# Patient Record
Sex: Female | Born: 1960 | Race: Black or African American | Hispanic: No | Marital: Single | Smoking: Current every day smoker
Health system: Southern US, Community
[De-identification: ages and names within clinical notes are randomized; demographics above are authoritative.]

## PROBLEM LIST (undated history)

## (undated) DIAGNOSIS — I10 Essential (primary) hypertension: Secondary | ICD-10-CM

## (undated) DIAGNOSIS — J45909 Unspecified asthma, uncomplicated: Secondary | ICD-10-CM

---

## 2011-05-09 ENCOUNTER — Other Ambulatory Visit (HOSPITAL_COMMUNITY): Payer: Self-pay | Admitting: Family Medicine

## 2011-05-09 DIAGNOSIS — Z1231 Encounter for screening mammogram for malignant neoplasm of breast: Secondary | ICD-10-CM

## 2011-06-05 ENCOUNTER — Ambulatory Visit (HOSPITAL_COMMUNITY)
Admission: RE | Admit: 2011-06-05 | Discharge: 2011-06-05 | Disposition: A | Discharge status: Home / Self Care | Payer: Self-pay | Source: Ambulatory Visit | Attending: Family Medicine | Admitting: Family Medicine

## 2011-06-05 DIAGNOSIS — Z1231 Encounter for screening mammogram for malignant neoplasm of breast: Secondary | ICD-10-CM | POA: Insufficient documentation

## 2011-07-02 ENCOUNTER — Other Ambulatory Visit: Payer: Self-pay | Admitting: Family Medicine

## 2011-07-02 DIAGNOSIS — R928 Other abnormal and inconclusive findings on diagnostic imaging of breast: Secondary | ICD-10-CM

## 2011-07-09 ENCOUNTER — Ambulatory Visit
Admission: RE | Admit: 2011-07-09 | Discharge: 2011-07-09 | Disposition: A | Discharge status: Home / Self Care | Payer: Self-pay | Source: Ambulatory Visit | Attending: Family Medicine | Admitting: Family Medicine

## 2011-07-09 DIAGNOSIS — R928 Other abnormal and inconclusive findings on diagnostic imaging of breast: Secondary | ICD-10-CM

## 2011-07-10 IMAGING — MG MM DIGITAL DIAGNOSTIC LIMITED BILAT
5 series · 5 of 5 positions shown · non-contrast
Comparison: Prior outside studies from [REDACTED] in LOCKEDOUT
LOCKEDOUT, dated [DATE] bilaterally and [DATE] on the left
are now available for review.

CLINICAL DATA: The patient returns for evaluation of
calcifications in the left breast and right axillary lymph nodes.

DIGITAL DIAGNOSTIC BILATERAL LIMITED MAMMOGRAM

[L CC (1 of 2)]
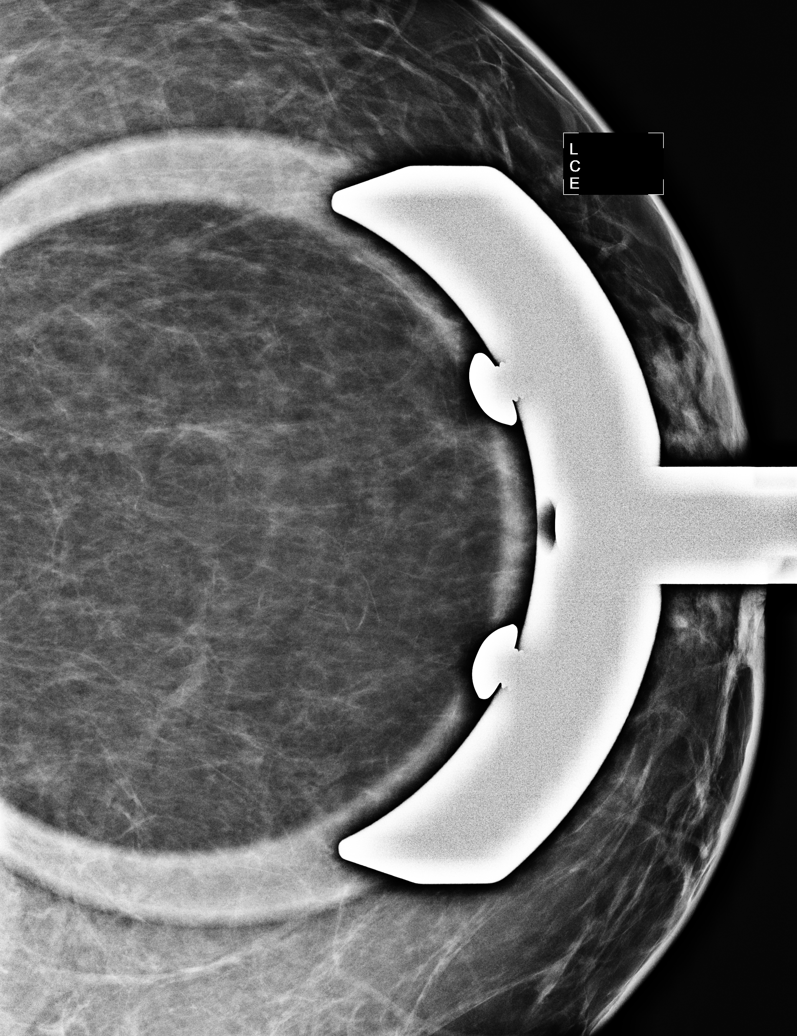

[L ML]
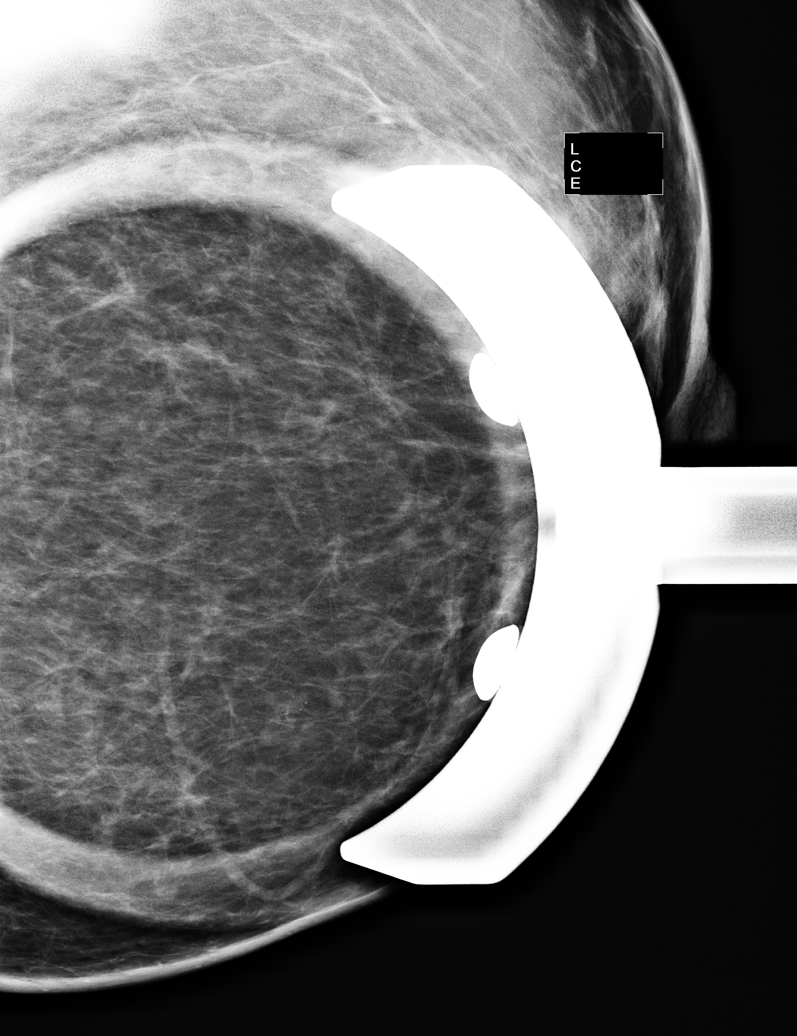

[R MLO]
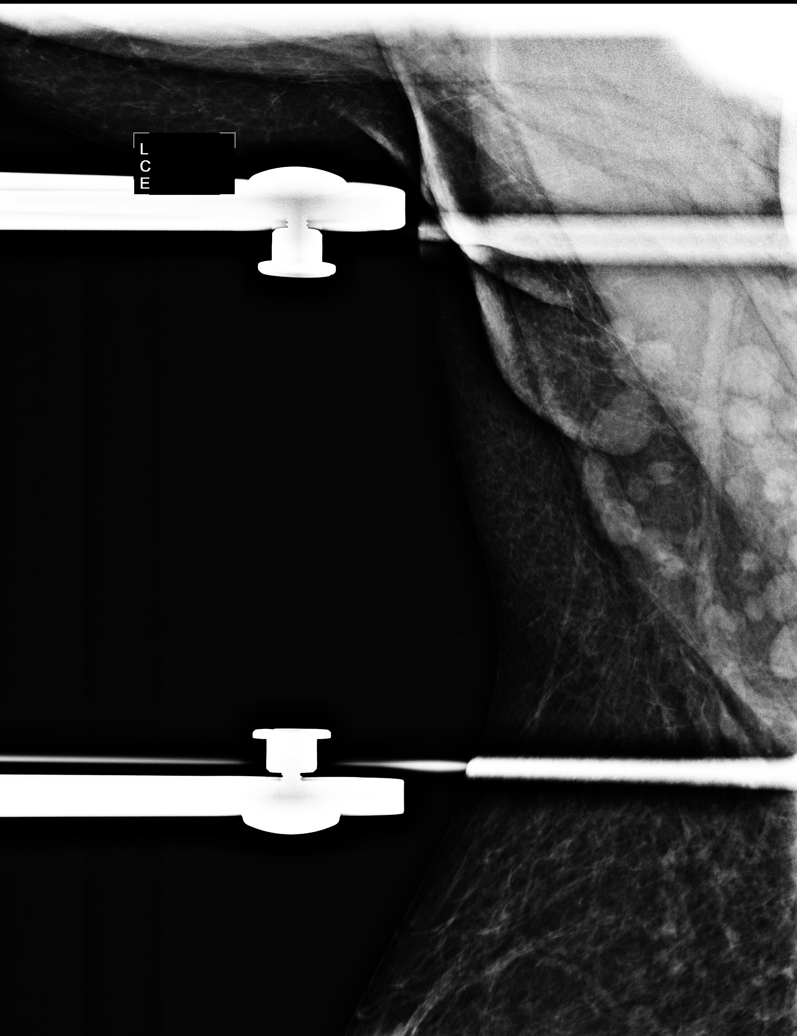

[R ML]
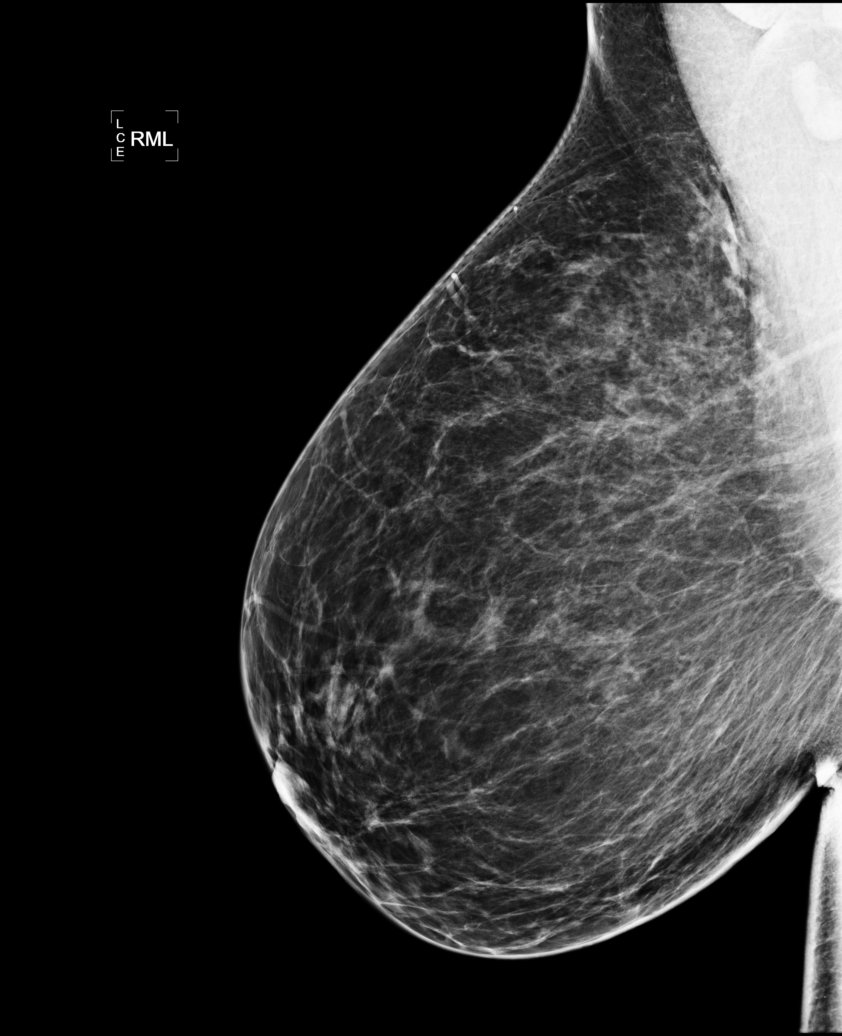

[L CC (2 of 2)]
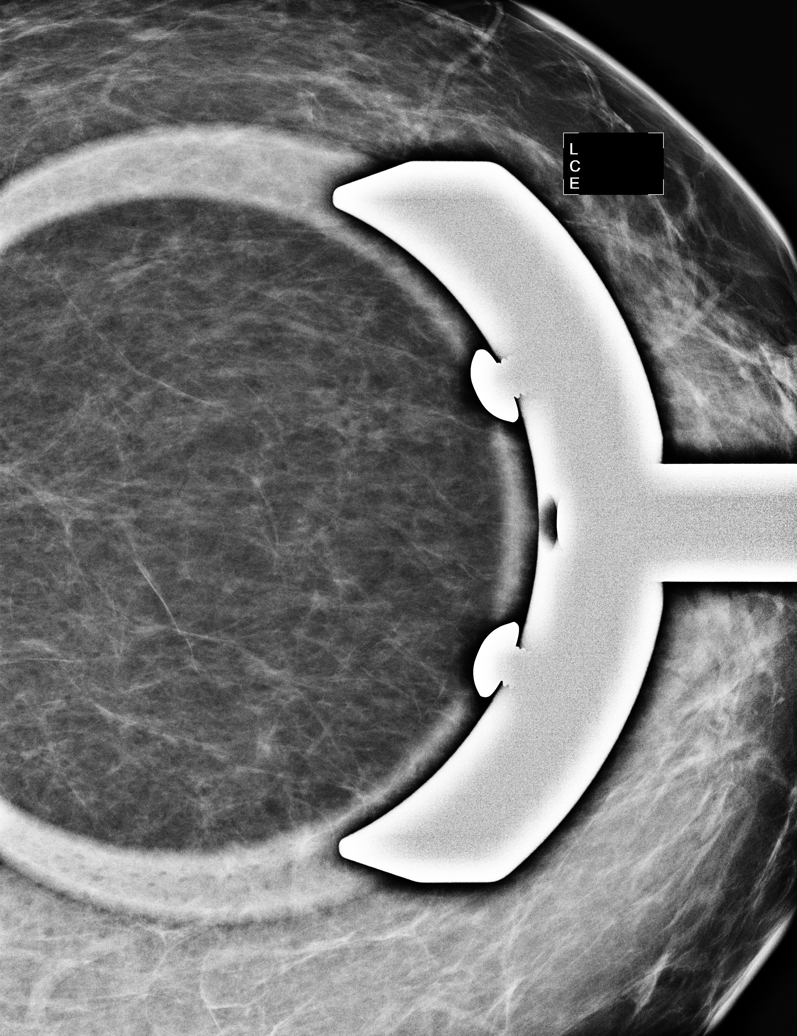

[5 of 5 positions shown; findings below may reference images not displayed]

FINDINGS: Additional views of the left breast demonstrate layering
calcifications consistent with benign milk of calcium.  These are
unchanged from prior studies.

Additional views of the right axilla demonstrate multiple normal-
appearing lymph nodes which are unchanged from previous studies.
IMPRESSION: Benign calcifications on the left.  Benign lymph nodes on the
right.  No mammographic evidence of malignancy.  Yearly screening
mammography is suggested.

BI-RADS CATEGORY 2:  Benign finding(s).

## 2011-07-24 ENCOUNTER — Other Ambulatory Visit: Payer: Self-pay | Admitting: Internal Medicine

## 2012-07-27 ENCOUNTER — Emergency Department (INDEPENDENT_AMBULATORY_CARE_PROVIDER_SITE_OTHER)
Admission: EM | Admit: 2012-07-27 | Discharge: 2012-07-27 | Disposition: A | Discharge status: Home / Self Care | Payer: Self-pay | Source: Home / Self Care | Attending: Family Medicine | Admitting: Family Medicine

## 2012-07-27 ENCOUNTER — Emergency Department (INDEPENDENT_AMBULATORY_CARE_PROVIDER_SITE_OTHER): Payer: Self-pay

## 2012-07-27 ENCOUNTER — Encounter (HOSPITAL_COMMUNITY): Payer: Self-pay | Admitting: *Deleted

## 2012-07-27 DIAGNOSIS — S20212A Contusion of left front wall of thorax, initial encounter: Secondary | ICD-10-CM

## 2012-07-27 DIAGNOSIS — S20219A Contusion of unspecified front wall of thorax, initial encounter: Secondary | ICD-10-CM

## 2012-07-27 HISTORY — DX: Unspecified asthma, uncomplicated: J45.909

## 2012-07-27 MED ORDER — HYDROCODONE-ACETAMINOPHEN 5-325 MG PO TABS: 1.0000 | ORAL_TABLET | Freq: Four times a day (QID) | ORAL | Status: DC | PRN
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
## 2012-10-20 ENCOUNTER — Encounter (HOSPITAL_COMMUNITY): Payer: Self-pay | Admitting: *Deleted

## 2012-10-20 ENCOUNTER — Emergency Department (INDEPENDENT_AMBULATORY_CARE_PROVIDER_SITE_OTHER)
Admission: EM | Admit: 2012-10-20 | Discharge: 2012-10-20 | Disposition: A | Discharge status: Home / Self Care | Payer: Self-pay | Source: Home / Self Care | Attending: Family Medicine | Admitting: Family Medicine

## 2012-10-20 DIAGNOSIS — H6123 Impacted cerumen, bilateral: Secondary | ICD-10-CM

## 2012-10-20 DIAGNOSIS — H6120 Impacted cerumen, unspecified ear: Secondary | ICD-10-CM

## 2012-10-20 HISTORY — DX: Essential (primary) hypertension: I10

## 2012-10-20 NOTE — ED Provider Notes (Addendum)
   History    CSN: [id number] Arrival date & time 10/20/12  0827  None    Chief Complaint  Patient presents with  . Dizziness  . Otalgia   (Consider location/radiation/quality/duration/timing/severity/associated sxs/prior Treatment) Patient is a 52 y.o. female presenting with ear pain. The history is provided by the patient.  Otalgia Location:  Left Behind ear:  No abnormality Quality:  Pressure Severity:  Mild Duration:  2 days Progression:  Unchanged Chronicity:  Recurrent (h/o recurrent cerumen buildup, not removed in yrs.) Associated symptoms: no congestion, no ear discharge, no neck pain and no rhinorrhea    Past Medical History  Diagnosis Date  . Asthma   . Hypertension    History reviewed. No pertinent past surgical history. Family History  Problem Relation Age of Onset  . Cancer Mother    History  Substance Use Topics  . Smoking status: Current Every Day Smoker    Types: Cigarettes  . Smokeless tobacco: Not on file  . Alcohol Use: Yes   OB History   Grav Para Term Preterm Abortions TAB SAB Ect Mult Living                 Review of Systems  Constitutional: Negative.   HENT: Positive for ear pain. Negative for congestion, rhinorrhea, neck pain, postnasal drip and ear discharge.   Respiratory: Negative for shortness of breath.   Cardiovascular: Negative for palpitations.    Allergies  Review of patient's allergies indicates no known allergies.  Home Medications   Current Outpatient Rx  Name  Route  Sig  Dispense  Refill  . HYDROcodone-acetaminophen (NORCO/VICODIN) 5-325 MG per tablet   Oral   Take 1 tablet by mouth every 6 (six) hours as needed for pain.   10 tablet   0    BP 124/86  Pulse 105  Temp(Src) 99.4 F (37.4 C) (Oral)  Resp 23  SpO2 100% Physical Exam  Nursing note and vitals reviewed. Constitutional: She is oriented to person, place, and time. She appears well-developed and well-nourished.  HENT:  Head: Normocephalic.  Nose:  Nose normal.  Mouth/Throat: Oropharynx is clear and moist.  Cerumen obstruction of canals bilat,  Eyes: Conjunctivae and EOM are normal. Pupils are equal, round, and reactive to light.  Neck: Normal range of motion. Neck supple.  Lymphadenopathy:    She has no cervical adenopathy.  Neurological: She is alert and oriented to person, place, and time. No cranial nerve deficit. Coordination normal.  Skin: Skin is warm and dry.  Psychiatric: She has a normal mood and affect.    ED Course  Procedures (including critical care time) Labs Reviewed - No data to display No results found. 1. Obstruction of ventilation tube of both ears by cerumen     MDM  Sx relieved after ear irrig.  Linna Hoff, MD 10/20/12 6213  Linna Hoff, MD 10/20/12 [phone]

## 2012-10-20 NOTE — ED Notes (Signed)
Pt is here with complaints of 2 day history of positional dizziness and mild left ear pain.  Pt has hx of HTN, but has not taken antihypertensives in 1 years.

## 2015-11-12 ENCOUNTER — Emergency Department (HOSPITAL_COMMUNITY)
Admission: EM | Admit: 2015-11-12 | Discharge: 2015-11-12 | Disposition: A | Discharge status: Home / Self Care | Payer: 59 | Attending: Emergency Medicine | Admitting: Emergency Medicine

## 2015-11-12 ENCOUNTER — Emergency Department (HOSPITAL_COMMUNITY): Payer: 59

## 2015-11-12 ENCOUNTER — Encounter (HOSPITAL_COMMUNITY): Payer: Self-pay

## 2015-11-12 DIAGNOSIS — S76019A Strain of muscle, fascia and tendon of unspecified hip, initial encounter: Secondary | ICD-10-CM | POA: Diagnosis not present

## 2015-11-12 DIAGNOSIS — F17210 Nicotine dependence, cigarettes, uncomplicated: Secondary | ICD-10-CM | POA: Diagnosis not present

## 2015-11-12 DIAGNOSIS — W19XXXA Unspecified fall, initial encounter: Secondary | ICD-10-CM

## 2015-11-12 DIAGNOSIS — Z79899 Other long term (current) drug therapy: Secondary | ICD-10-CM | POA: Diagnosis not present

## 2015-11-12 DIAGNOSIS — I10 Essential (primary) hypertension: Secondary | ICD-10-CM | POA: Diagnosis not present

## 2015-11-12 DIAGNOSIS — Y9301 Activity, walking, marching and hiking: Secondary | ICD-10-CM | POA: Insufficient documentation

## 2015-11-12 DIAGNOSIS — Y929 Unspecified place or not applicable: Secondary | ICD-10-CM | POA: Insufficient documentation

## 2015-11-12 DIAGNOSIS — Y999 Unspecified external cause status: Secondary | ICD-10-CM | POA: Diagnosis not present

## 2015-11-12 DIAGNOSIS — T148XXA Other injury of unspecified body region, initial encounter: Secondary | ICD-10-CM

## 2015-11-12 DIAGNOSIS — X501XXA Overexertion from prolonged static or awkward postures, initial encounter: Secondary | ICD-10-CM | POA: Insufficient documentation

## 2015-11-12 DIAGNOSIS — M25552 Pain in left hip: Secondary | ICD-10-CM | POA: Diagnosis present

## 2015-11-12 MED ORDER — CYCLOBENZAPRINE HCL 10 MG PO TABS: 10.0000 mg | ORAL_TABLET | Freq: Two times a day (BID) | ORAL | 0 refills | Status: DC | PRN

## 2015-11-12 NOTE — Progress Notes (Signed)
Pt stated her neighbor will take her home. Pt denies loss of bowel or bladder.

## 2015-11-12 NOTE — ED Provider Notes (Signed)
[facility]-EMERGENCY DEPT Provider Note   CSN: [id number] Arrival date & time: 11/12/15  1337  First Provider Contact:  First MD Initiated Contact with Patient 11/12/15 1553        History   Chief Complaint Chief Complaint  Patient presents with  . Back Pain  . Shoulder Pain    HPI Nina Wilson is a 55 y.o. female.  Patient presents to the ED with a chief complaint of fall.  She states that she tripped over her cat yesterday.  She states that she has had pain in her groin from where she "did the splits."  She states that she tried taking some left over pain medicine with modest relief.  She reports mild generalized aches and pains in her knees and shoulders.  She has been able to ambulate with the use of canes.  She denies any other injuries.   The history is provided by the patient. No language interpreter was used.    Past Medical History:  Diagnosis Date  . Asthma   . Hypertension     There are no active problems to display for this patient.   History reviewed. No pertinent surgical history.  OB History    No data available       Home Medications    Prior to Admission medications   Medication Sig Start Date End Date Taking? Authorizing Provider  HYDROcodone-acetaminophen (NORCO/VICODIN) 5-325 MG per tablet Take 1 tablet by mouth every 6 (six) hours as needed for pain. 07/27/12   Linna Hoff, MD    Family History Family History  Problem Relation Age of Onset  . Cancer Mother     Social History Social History  Substance Use Topics  . Smoking status: Current Every Day Smoker    Types: Cigarettes  . Smokeless tobacco: Never Used  . Alcohol use Yes     Allergies   Review of patient's allergies indicates no known allergies.   Review of Systems Review of Systems  All other systems reviewed and are negative.    Physical Exam Updated Vital Signs BP 137/78 (BP Location: Right Arm)   Pulse 85   Temp 98.2 F (36.8 C) (Oral)   Resp 18   SpO2 99%    Physical Exam Physical Exam  Constitutional: Pt appears well-developed and well-nourished. No distress.  HENT:  Head: Normocephalic and atraumatic.  Eyes: Conjunctivae are normal.  Neck: Normal range of motion.  Cardiovascular: Normal rate, regular rhythm and intact distal pulses.   Capillary refill < 3 sec  Pulmonary/Chest: Effort normal and breath sounds normal.  Musculoskeletal: Pt exhibits tenderness. Pt exhibits no edema.  ROM: Bilateral hips 5/5, bilateral knees 5/5, bilateral shoulders 5/5  Ambulates with canes Neurological: Pt  is alert. Coordination normal.  Sensation: 5/5 throughout Strength: 5/5 throughout Skin: Skin is warm and dry. Pt is not diaphoretic.  No tenting of the skin  Psychiatric: Pt has a normal mood and affect.  Nursing note and vitals reviewed.   ED Treatments / Results   Radiology Dg Pelvis 1-2 Views  Result Date: 11/12/2015 CLINICAL DATA:  Trip and fall yesterday with hip pain, initial encounter EXAM: PELVIS - 1-2 VIEW COMPARISON:  None. FINDINGS: The pelvic ring is intact. Degenerative changes of the hip joints are noted bilaterally. No acute fracture or dislocation is seen. No soft tissue abnormality is noted. IMPRESSION: No acute abnormality seen. Electronically Signed   By: Alcide Clever M.D.   On: 11/12/2015 16:31   Procedures Procedures (including critical  care time)  Medications Ordered in ED Medications - No data to display   Initial Impression / Assessment and Plan / ED Course  I have reviewed the triage vital signs and the nursing notes.  Pertinent labs & imaging results that were available during my care of the patient were reviewed by me and considered in my medical decision making (see chart for details).  Clinical Course    Patient with mechanical fall after tripping over her cat yesterday.  Complains of persistent hip pain.  Ambulates with canes.    X-rays are negative.  Will discharge to home with flexeril and instructions  for OTC ice and NSAIDs.  Patient understands and agrees with the plan.  Final Clinical Impressions(s) / ED Diagnoses   Final diagnoses:  Fall, initial encounter  Muscle strain    New Prescriptions New Prescriptions   CYCLOBENZAPRINE (FLEXERIL) 10 MG TABLET    Take 1 tablet (10 mg total) by mouth 2 (two) times daily as needed for muscle spasms.     Roxy Horseman, PA-C 11/12/15 1645    Shaune Pollack, MD 11/13/15 [phone]

## 2015-11-12 NOTE — ED Triage Notes (Addendum)
Pt stumbled over cat yesterday. States difficulty walking today.  Pain in left knee.  Bilateral pelvis.  Left shoulder.  Landed on Left knee and rt leg twisted but only slight tenderness to that leg.  Pt has full range of motion to knee and shoulder.  Able to walk but tender.

## 2016-01-11 ENCOUNTER — Other Ambulatory Visit: Payer: Self-pay | Admitting: Primary Care

## 2016-01-11 DIAGNOSIS — Z1231 Encounter for screening mammogram for malignant neoplasm of breast: Secondary | ICD-10-CM

## 2016-01-18 ENCOUNTER — Ambulatory Visit
Admission: RE | Admit: 2016-01-18 | Discharge: 2016-01-18 | Disposition: A | Discharge status: Home / Self Care | Payer: 59 | Source: Ambulatory Visit | Attending: Primary Care | Admitting: Primary Care

## 2016-01-18 DIAGNOSIS — Z1231 Encounter for screening mammogram for malignant neoplasm of breast: Secondary | ICD-10-CM

## 2017-07-04 ENCOUNTER — Encounter (HOSPITAL_COMMUNITY): Payer: Self-pay | Admitting: Family Medicine

## 2017-07-04 ENCOUNTER — Other Ambulatory Visit: Payer: Self-pay

## 2017-07-04 ENCOUNTER — Ambulatory Visit (HOSPITAL_COMMUNITY)
Admission: EM | Admit: 2017-07-04 | Discharge: 2017-07-04 | Disposition: A | Discharge status: Home / Self Care | Payer: 59 | Attending: Family Medicine | Admitting: Family Medicine

## 2017-07-04 DIAGNOSIS — J45909 Unspecified asthma, uncomplicated: Secondary | ICD-10-CM | POA: Insufficient documentation

## 2017-07-04 DIAGNOSIS — N760 Acute vaginitis: Secondary | ICD-10-CM | POA: Insufficient documentation

## 2017-07-04 DIAGNOSIS — F17210 Nicotine dependence, cigarettes, uncomplicated: Secondary | ICD-10-CM | POA: Insufficient documentation

## 2017-07-04 DIAGNOSIS — R300 Dysuria: Secondary | ICD-10-CM | POA: Diagnosis not present

## 2017-07-04 DIAGNOSIS — I10 Essential (primary) hypertension: Secondary | ICD-10-CM | POA: Diagnosis not present

## 2017-07-04 LAB — POCT URINALYSIS DIP (DEVICE)
Bilirubin Urine: NEGATIVE
Glucose, UA: NEGATIVE mg/dL
Ketones, ur: NEGATIVE mg/dL
Leukocytes, UA: NEGATIVE
Nitrite: NEGATIVE
Protein, ur: NEGATIVE mg/dL
Specific Gravity, Urine: 1.025 (ref 1.005–1.030)
Urobilinogen, UA: 1 mg/dL (ref 0.0–1.0)
pH: 6.5 (ref 5.0–8.0)

## 2017-07-04 MED ORDER — FLUCONAZOLE 150 MG PO TABS: 150.0000 mg | ORAL_TABLET | Freq: Once | ORAL | 0 refills | Status: AC

## 2017-07-04 MED ORDER — METRONIDAZOLE 500 MG PO TABS: 500.0000 mg | ORAL_TABLET | Freq: Two times a day (BID) | ORAL | 0 refills | Status: AC

## 2017-07-04 NOTE — ED Triage Notes (Signed)
[facility], pt complains of itching during urination, burning, and clear discharge.

## 2017-07-04 NOTE — ED Provider Notes (Signed)
[facility]   [id number] 07/04/17 Arrival Time: 1725   SUBJECTIVE:  Nina Wilson is a 57 y.o. female who presents to the urgent care with complaint of vaginal infection x1 month, pt complains of itching during urination, burning, and clear discharge.   No unusual bleeding, abdominal or back pain.  Also, no fever.  Past Medical History:  Diagnosis Date  . Asthma   . Hypertension    Family History  Problem Relation Age of Onset  . Cancer Mother    Social History   Socioeconomic History  . Marital status: Single    Spouse name: Not on file  . Number of children: Not on file  . Years of education: Not on file  . Highest education level: Not on file  Occupational History  . Not on file  Social Needs  . Financial resource strain: Not on file  . Food insecurity:    Worry: Not on file    Inability: Not on file  . Transportation needs:    Medical: Not on file    Non-medical: Not on file  Tobacco Use  . Smoking status: Current Every Day Smoker    Types: Cigarettes  . Smokeless tobacco: Never Used  Substance and Sexual Activity  . Alcohol use: Yes  . Drug use: No  . Sexual activity: Yes    Birth control/protection: Post-menopausal  Lifestyle  . Physical activity:    Days per week: Not on file    Minutes per session: Not on file  . Stress: Not on file  Relationships  . Social connections:    Talks on phone: Not on file    Gets together: Not on file    Attends religious service: Not on file    Active member of club or organization: Not on file    Attends meetings of clubs or organizations: Not on file    Relationship status: Not on file  . Intimate partner violence:    Fear of current or ex partner: Not on file    Emotionally abused: Not on file    Physically abused: Not on file    Forced sexual activity: Not on file  Other Topics Concern  . Not on file  Social History Narrative  . Not on file   No outpatient medications have been marked as  taking for the 07/04/17 encounter [facility](Hospital Encounter).   No Known Allergies    ROS: As per HPI, remainder of ROS negative.   OBJECTIVE:   Vitals:   07/04/17 1742 07/04/17 1744  BP: 133/83   Pulse: 82   Resp: 17   Temp: 98.1 F (36.7 C)   TempSrc: Oral   SpO2: 98% 98%     General appearance: alert; no distress Eyes: PERRL; EOMI; conjunctiva normal HENT: normocephalic; atraumatic; ; oral mucosa normal Neck: supple Back: no CVA tenderness Extremities: no cyanosis or edema; symmetrical with no gross deformities Skin: warm and dry Neurologic: normal gait; grossly normal Psychological: alert and cooperative; normal mood and affect      Labs:  Results for orders placed or performed during the hospital encounter of 07/04/17  POCT urinalysis dip (device)  Result Value Ref Range   Glucose, UA NEGATIVE NEGATIVE mg/dL   Bilirubin Urine NEGATIVE NEGATIVE   Ketones, ur NEGATIVE NEGATIVE mg/dL   Specific Gravity, Urine 1.025 1.005 - 1.030   Hgb urine dipstick TRACE (A) NEGATIVE   pH 6.5 5.0 - 8.0   Protein, ur NEGATIVE NEGATIVE mg/dL   Urobilinogen, UA 1.0  0.0 - 1.0 mg/dL   Nitrite NEGATIVE NEGATIVE   Leukocytes, UA NEGATIVE NEGATIVE    Labs Reviewed  POCT URINALYSIS DIP (DEVICE) - Abnormal; Notable for the following components:      Result Value   Hgb urine dipstick TRACE (*)    All other components within normal limits  URINE CYTOLOGY ANCILLARY ONLY    No results found.     ASSESSMENT & PLAN:  1. Vaginitis and vulvovaginitis     Meds ordered this encounter  Medications  . metroNIDAZOLE (FLAGYL) 500 MG tablet    Sig: Take 1 tablet (500 mg total) by mouth 2 (two) times daily.    Dispense:  14 tablet    Refill:  0  . fluconazole (DIFLUCAN) 150 MG tablet    Sig: Take 1 tablet (150 mg total) by mouth once for 1 dose. Repeat if needed    Dispense:  2 tablet    Refill:  0    Reviewed expectations re: course of current medical issues. Questions  answered. Outlined signs and symptoms indicating need for more acute intervention. Patient verbalized understanding. After Visit Summary given.    Procedures:      Elvina Sidle, MD 07/04/17 1827

## 2017-07-07 LAB — URINE CYTOLOGY ANCILLARY ONLY
Chlamydia: NEGATIVE
Neisseria Gonorrhea: NEGATIVE
Trichomonas: NEGATIVE

## 2017-07-08 LAB — URINE CYTOLOGY ANCILLARY ONLY: Candida vaginitis: NEGATIVE

## 2017-07-15 ENCOUNTER — Telehealth (HOSPITAL_COMMUNITY): Payer: Self-pay

## 2017-07-15 NOTE — Telephone Encounter (Signed)
Attempt to reach patient regarding test results from recent visit, no answer and voicemail not available.

## 2017-09-10 ENCOUNTER — Other Ambulatory Visit (HOSPITAL_COMMUNITY): Payer: Self-pay | Admitting: Family Medicine

## 2017-09-10 DIAGNOSIS — Z720 Tobacco use: Secondary | ICD-10-CM

## 2018-07-20 ENCOUNTER — Other Ambulatory Visit: Payer: Self-pay | Admitting: Internal Medicine

## 2018-07-20 ENCOUNTER — Other Ambulatory Visit: Payer: Self-pay | Admitting: Family Medicine

## 2018-07-20 DIAGNOSIS — Z1231 Encounter for screening mammogram for malignant neoplasm of breast: Secondary | ICD-10-CM

## 2018-09-15 ENCOUNTER — Ambulatory Visit: Payer: 59

## 2018-09-15 ENCOUNTER — Ambulatory Visit
Admission: RE | Admit: 2018-09-15 | Discharge: 2018-09-15 | Disposition: A | Discharge status: Home / Self Care | Payer: 59 | Source: Ambulatory Visit | Attending: Family Medicine | Admitting: Family Medicine

## 2018-09-15 ENCOUNTER — Other Ambulatory Visit: Payer: Self-pay

## 2018-09-15 DIAGNOSIS — Z1231 Encounter for screening mammogram for malignant neoplasm of breast: Secondary | ICD-10-CM

## 2019-11-08 ENCOUNTER — Other Ambulatory Visit: Payer: Self-pay | Admitting: Family Medicine

## 2019-11-08 DIAGNOSIS — Z1231 Encounter for screening mammogram for malignant neoplasm of breast: Secondary | ICD-10-CM

## 2019-11-15 ENCOUNTER — Ambulatory Visit
Admission: RE | Admit: 2019-11-15 | Discharge: 2019-11-15 | Disposition: A | Discharge status: Home / Self Care | Payer: 59 | Source: Ambulatory Visit | Attending: Family Medicine | Admitting: Family Medicine

## 2019-11-15 ENCOUNTER — Other Ambulatory Visit: Payer: Self-pay

## 2019-11-15 DIAGNOSIS — Z1231 Encounter for screening mammogram for malignant neoplasm of breast: Secondary | ICD-10-CM

## 2024-04-30 ENCOUNTER — Other Ambulatory Visit: Payer: Self-pay | Admitting: Family

## 2024-04-30 DIAGNOSIS — F17200 Nicotine dependence, unspecified, uncomplicated: Secondary | ICD-10-CM

## 2024-04-30 DIAGNOSIS — F17210 Nicotine dependence, cigarettes, uncomplicated: Secondary | ICD-10-CM

## 2024-05-13 ENCOUNTER — Ambulatory Visit
Admission: RE | Admit: 2024-05-13 | Discharge: 2024-05-13 | Disposition: A | Discharge status: Home / Self Care | Source: Ambulatory Visit | Attending: Family | Admitting: Family

## 2024-05-13 DIAGNOSIS — Z122 Encounter for screening for malignant neoplasm of respiratory organs: Secondary | ICD-10-CM

## 2024-05-13 DIAGNOSIS — F17200 Nicotine dependence, unspecified, uncomplicated: Secondary | ICD-10-CM
